# Patient Record
Sex: Male | Born: 1990 | Race: White | Hispanic: No | Marital: Single | Smoking: Never smoker
Health system: Southern US, Community
[De-identification: ages and names within clinical notes are randomized; demographics above are authoritative.]

---
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
## 2019-07-13 ENCOUNTER — Other Ambulatory Visit: Payer: Self-pay

## 2019-07-13 DIAGNOSIS — Z20822 Contact with and (suspected) exposure to covid-19: Secondary | ICD-10-CM

## 2019-07-13 DIAGNOSIS — Z20828 Contact with and (suspected) exposure to other viral communicable diseases: Secondary | ICD-10-CM

## 2019-07-15 LAB — NOVEL CORONAVIRUS, NAA: SARS-CoV-2, NAA: NOT DETECTED

## 2020-07-07 ENCOUNTER — Other Ambulatory Visit: Payer: Self-pay

## 2020-07-07 ENCOUNTER — Ambulatory Visit (LOCAL_COMMUNITY_HEALTH_CENTER): Payer: Self-pay

## 2020-07-07 DIAGNOSIS — Z23 Encounter for immunization: Secondary | ICD-10-CM

## 2021-05-07 ENCOUNTER — Emergency Department
Admission: EM | Admit: 2021-05-07 | Discharge: 2021-05-07 | Disposition: A | Discharge status: Home / Self Care | Payer: Self-pay | Attending: Emergency Medicine | Admitting: Emergency Medicine

## 2021-05-07 ENCOUNTER — Emergency Department: Payer: Self-pay

## 2021-05-07 ENCOUNTER — Other Ambulatory Visit: Payer: Self-pay

## 2021-05-07 DIAGNOSIS — Y92410 Unspecified street and highway as the place of occurrence of the external cause: Secondary | ICD-10-CM | POA: Insufficient documentation

## 2021-05-07 DIAGNOSIS — M5134 Other intervertebral disc degeneration, thoracic region: Secondary | ICD-10-CM | POA: Insufficient documentation

## 2021-05-07 DIAGNOSIS — M545 Low back pain, unspecified: Secondary | ICD-10-CM | POA: Diagnosis present
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
## 2021-05-07 MED ORDER — METHOCARBAMOL 500 MG PO TABS: 500.0000 mg | ORAL_TABLET | Freq: Four times a day (QID) | ORAL | 0 refills | Status: AC

## 2021-05-07 MED ORDER — MELOXICAM 15 MG PO TABS: 15.0000 mg | ORAL_TABLET | Freq: Every day | ORAL | 0 refills | Status: AC

## 2021-05-07 MED ORDER — METHOCARBAMOL 500 MG PO TABS
1000.0000 mg | ORAL_TABLET | Freq: Once | ORAL | Status: AC
  Administered 2021-05-07: 1000 mg via ORAL
  Filled 2021-05-07: qty 2

## 2021-05-07 MED ORDER — MELOXICAM 7.5 MG PO TABS
15.0000 mg | ORAL_TABLET | Freq: Once | ORAL | Status: AC
  Administered 2021-05-07: 15 mg via ORAL
  Filled 2021-05-07: qty 2
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
